# Patient Record
Sex: Male | Born: 1970 | Race: Black or African American | Hispanic: No | Smoking: Never smoker
Health system: Southern US, Community
[De-identification: ages and names within clinical notes are randomized; demographics above are authoritative.]

## PROBLEM LIST (undated history)

## (undated) DIAGNOSIS — G4730 Sleep apnea, unspecified: Secondary | ICD-10-CM

## (undated) DIAGNOSIS — I10 Essential (primary) hypertension: Secondary | ICD-10-CM

## (undated) DIAGNOSIS — C801 Malignant (primary) neoplasm, unspecified: Secondary | ICD-10-CM

## (undated) DIAGNOSIS — E7800 Pure hypercholesterolemia, unspecified: Secondary | ICD-10-CM

## (undated) DIAGNOSIS — M940 Chondrocostal junction syndrome [Tietze]: Secondary | ICD-10-CM

## (undated) HISTORY — PX: ABDOMINAL SURGERY: SHX537

## (undated) HISTORY — PX: SMALL INTESTINE SURGERY: SHX150

---

## 1998-06-18 ENCOUNTER — Emergency Department (HOSPITAL_COMMUNITY): Admission: EM | Admit: 1998-06-18 | Discharge: 1998-06-18 | Payer: Self-pay | Admitting: Emergency Medicine

## 2001-10-25 ENCOUNTER — Encounter: Payer: Self-pay | Admitting: Emergency Medicine

## 2001-10-25 ENCOUNTER — Emergency Department (HOSPITAL_COMMUNITY): Admission: EM | Admit: 2001-10-25 | Discharge: 2001-10-25 | Payer: Self-pay | Admitting: Emergency Medicine

## 2001-11-05 ENCOUNTER — Ambulatory Visit (HOSPITAL_COMMUNITY): Admission: RE | Admit: 2001-11-05 | Discharge: 2001-11-05 | Payer: Self-pay | Admitting: Orthopedic Surgery

## 2001-11-05 ENCOUNTER — Encounter: Payer: Self-pay | Admitting: Orthopedic Surgery

## 2003-03-01 ENCOUNTER — Emergency Department (HOSPITAL_COMMUNITY): Admission: EM | Admit: 2003-03-01 | Discharge: 2003-03-01 | Payer: Self-pay | Admitting: Emergency Medicine

## 2004-01-25 ENCOUNTER — Emergency Department (HOSPITAL_COMMUNITY): Admission: EM | Admit: 2004-01-25 | Discharge: 2004-01-26 | Payer: Self-pay | Admitting: Emergency Medicine

## 2004-01-29 ENCOUNTER — Emergency Department (HOSPITAL_COMMUNITY): Admission: EM | Admit: 2004-01-29 | Discharge: 2004-01-29 | Payer: Self-pay | Admitting: Family Medicine

## 2007-04-26 ENCOUNTER — Ambulatory Visit: Payer: Self-pay | Admitting: *Deleted

## 2007-04-26 ENCOUNTER — Emergency Department (HOSPITAL_COMMUNITY): Admission: EM | Admit: 2007-04-26 | Discharge: 2007-04-26 | Payer: Self-pay | Admitting: Emergency Medicine

## 2007-06-07 ENCOUNTER — Ambulatory Visit: Payer: Self-pay | Admitting: Internal Medicine

## 2007-06-07 LAB — CONVERTED CEMR LAB
BUN: 14 mg/dL (ref 6–23)
Chloride: 103 meq/L (ref 96–112)
Glucose, Bld: 78 mg/dL (ref 70–99)
Potassium: 4.2 meq/L (ref 3.5–5.3)

## 2007-06-29 ENCOUNTER — Ambulatory Visit: Payer: Self-pay | Admitting: Family Medicine

## 2007-07-29 ENCOUNTER — Ambulatory Visit: Payer: Self-pay | Admitting: Internal Medicine

## 2007-09-10 ENCOUNTER — Ambulatory Visit: Payer: Self-pay | Admitting: Internal Medicine

## 2007-09-10 LAB — CONVERTED CEMR LAB
AST: 19 units/L (ref 0–37)
Albumin: 4.5 g/dL (ref 3.5–5.2)
Alkaline Phosphatase: 67 units/L (ref 39–117)
Basophils Absolute: 0 10*3/uL (ref 0.0–0.1)
Basophils Relative: 0 % (ref 0–1)
Eosinophils Absolute: 0 10*3/uL — ABNORMAL LOW (ref 0.2–0.7)
LDL Cholesterol: 161 mg/dL — ABNORMAL HIGH (ref 0–99)
MCHC: 32.5 g/dL (ref 30.0–36.0)
MCV: 83.4 fL (ref 78.0–100.0)
Microalb, Ur: 0.2 mg/dL (ref 0.00–1.89)
Neutrophils Relative %: 61 % (ref 43–77)
Platelets: 302 10*3/uL (ref 150–400)
Potassium: 3.7 meq/L (ref 3.5–5.3)
Sodium: 139 meq/L (ref 135–145)
Total Protein: 8.5 g/dL — ABNORMAL HIGH (ref 6.0–8.3)
WBC: 6.8 10*3/uL (ref 4.0–10.5)

## 2007-12-14 ENCOUNTER — Ambulatory Visit: Payer: Self-pay | Admitting: Internal Medicine

## 2008-02-15 ENCOUNTER — Ambulatory Visit (HOSPITAL_COMMUNITY): Admission: RE | Admit: 2008-02-15 | Discharge: 2008-02-15 | Payer: Self-pay | Admitting: Internal Medicine

## 2008-02-15 ENCOUNTER — Ambulatory Visit: Payer: Self-pay | Admitting: Internal Medicine

## 2008-02-15 LAB — CONVERTED CEMR LAB
ALT: 30 units/L (ref 0–53)
AST: 21 units/L (ref 0–37)
Albumin: 4.3 g/dL (ref 3.5–5.2)
Calcium: 9.4 mg/dL (ref 8.4–10.5)
Chloride: 104 meq/L (ref 96–112)
Creatinine, Ser: 0.93 mg/dL (ref 0.40–1.50)
LDL Cholesterol: 70 mg/dL (ref 0–99)
Potassium: 4.2 meq/L (ref 3.5–5.3)
Total CHOL/HDL Ratio: 2.8

## 2008-05-24 ENCOUNTER — Ambulatory Visit: Payer: Self-pay | Admitting: Internal Medicine

## 2008-05-24 LAB — CONVERTED CEMR LAB

## 2008-09-13 ENCOUNTER — Ambulatory Visit: Payer: Self-pay | Admitting: Internal Medicine

## 2009-01-03 ENCOUNTER — Ambulatory Visit: Payer: Self-pay | Admitting: Internal Medicine

## 2009-11-05 ENCOUNTER — Ambulatory Visit: Payer: Self-pay | Admitting: Internal Medicine

## 2009-11-05 LAB — CONVERTED CEMR LAB
CO2: 25 meq/L (ref 19–32)
Calcium: 9.3 mg/dL (ref 8.4–10.5)
Free T4: 0.82 ng/dL (ref 0.80–1.80)
Hgb A1c MFr Bld: 6.3 % — ABNORMAL HIGH (ref 4.6–6.1)
Sodium: 141 meq/L (ref 135–145)

## 2009-12-18 ENCOUNTER — Ambulatory Visit: Payer: Self-pay | Admitting: Internal Medicine

## 2010-03-19 ENCOUNTER — Ambulatory Visit: Payer: Self-pay | Admitting: Internal Medicine

## 2010-03-19 ENCOUNTER — Ambulatory Visit (HOSPITAL_COMMUNITY): Admission: RE | Admit: 2010-03-19 | Discharge: 2010-03-19 | Payer: Self-pay | Admitting: Internal Medicine

## 2010-03-21 ENCOUNTER — Ambulatory Visit: Payer: Self-pay | Admitting: Internal Medicine

## 2010-04-02 ENCOUNTER — Ambulatory Visit: Payer: Self-pay | Admitting: Internal Medicine

## 2011-05-08 ENCOUNTER — Emergency Department (HOSPITAL_COMMUNITY)
Admission: EM | Admit: 2011-05-08 | Discharge: 2011-05-08 | Disposition: A | Discharge status: Home / Self Care | Payer: BC Managed Care – PPO | Attending: Emergency Medicine | Admitting: Emergency Medicine

## 2011-05-08 DIAGNOSIS — I10 Essential (primary) hypertension: Secondary | ICD-10-CM | POA: Insufficient documentation

## 2011-05-08 DIAGNOSIS — Z79899 Other long term (current) drug therapy: Secondary | ICD-10-CM | POA: Insufficient documentation

## 2011-06-19 ENCOUNTER — Emergency Department (HOSPITAL_COMMUNITY)
Admission: EM | Admit: 2011-06-19 | Discharge: 2011-06-19 | Disposition: A | Discharge status: Other Acute Inpatient Hospital | Payer: BC Managed Care – PPO | Source: Home / Self Care | Attending: Emergency Medicine | Admitting: Emergency Medicine

## 2011-06-19 ENCOUNTER — Inpatient Hospital Stay (HOSPITAL_COMMUNITY)
Admission: EM | Admit: 2011-06-19 | Discharge: 2011-06-20 | DRG: 145 | Disposition: A | Discharge status: Home / Self Care | Payer: BC Managed Care – PPO | Source: Other Acute Inpatient Hospital | Attending: Interventional Cardiology | Admitting: Interventional Cardiology

## 2011-06-19 ENCOUNTER — Emergency Department (HOSPITAL_COMMUNITY): Payer: BC Managed Care – PPO

## 2011-06-19 DIAGNOSIS — E785 Hyperlipidemia, unspecified: Secondary | ICD-10-CM | POA: Diagnosis present

## 2011-06-19 DIAGNOSIS — I309 Acute pericarditis, unspecified: Principal | ICD-10-CM | POA: Diagnosis present

## 2011-06-19 DIAGNOSIS — R079 Chest pain, unspecified: Secondary | ICD-10-CM | POA: Insufficient documentation

## 2011-06-19 DIAGNOSIS — Z7982 Long term (current) use of aspirin: Secondary | ICD-10-CM

## 2011-06-19 DIAGNOSIS — R002 Palpitations: Secondary | ICD-10-CM | POA: Diagnosis present

## 2011-06-19 DIAGNOSIS — I10 Essential (primary) hypertension: Secondary | ICD-10-CM | POA: Insufficient documentation

## 2011-06-19 DIAGNOSIS — G4733 Obstructive sleep apnea (adult) (pediatric): Secondary | ICD-10-CM | POA: Diagnosis present

## 2011-06-19 DIAGNOSIS — E669 Obesity, unspecified: Secondary | ICD-10-CM | POA: Diagnosis present

## 2011-06-19 DIAGNOSIS — R42 Dizziness and giddiness: Secondary | ICD-10-CM | POA: Diagnosis present

## 2011-06-19 DIAGNOSIS — I214 Non-ST elevation (NSTEMI) myocardial infarction: Secondary | ICD-10-CM | POA: Insufficient documentation

## 2011-06-19 LAB — POCT I-STAT TROPONIN I: Troponin i, poc: 0.14 ng/mL (ref 0.00–0.08)

## 2011-06-19 LAB — DIFFERENTIAL
Basophils Absolute: 0 10*3/uL (ref 0.0–0.1)
Basophils Relative: 0 % (ref 0–1)
Eosinophils Absolute: 0 10*3/uL (ref 0.0–0.7)
Eosinophils Relative: 0 % (ref 0–5)
Lymphocytes Relative: 32 % (ref 12–46)

## 2011-06-19 LAB — COMPREHENSIVE METABOLIC PANEL
AST: 38 U/L — ABNORMAL HIGH (ref 0–37)
Albumin: 4.1 g/dL (ref 3.5–5.2)
BUN: 15 mg/dL (ref 6–23)
Calcium: 9.8 mg/dL (ref 8.4–10.5)
Creatinine, Ser: 1.12 mg/dL (ref 0.50–1.35)

## 2011-06-19 LAB — CBC
MCHC: 34.2 g/dL (ref 30.0–36.0)
Platelets: 247 10*3/uL (ref 150–400)
RDW: 14.2 % (ref 11.5–15.5)
WBC: 7.2 10*3/uL (ref 4.0–10.5)

## 2011-06-19 LAB — TROPONIN I: Troponin I: 0.41 ng/mL (ref <0.30)

## 2011-06-19 LAB — PROTIME-INR
INR: 1.08 (ref 0.00–1.49)
Prothrombin Time: 14.2 seconds (ref 11.6–15.2)

## 2011-06-19 LAB — CARDIAC PANEL(CRET KIN+CKTOT+MB+TROPI)
CK, MB: 6.8 ng/mL (ref 0.3–4.0)
Relative Index: 0.6 (ref 0.0–2.5)
Troponin I: <0.3 ng/mL (ref <0.30)

## 2011-06-19 LAB — HEPARIN LEVEL (UNFRACTIONATED): Heparin Unfractionated: <0.1 IU/mL — ABNORMAL LOW (ref 0.30–0.70)

## 2011-06-19 LAB — CK TOTAL AND CKMB (NOT AT ARMC)
CK, MB: 6.9 ng/mL (ref 0.3–4.0)
Total CK: 1112 U/L — ABNORMAL HIGH (ref 7–232)

## 2011-06-19 LAB — D-DIMER, QUANTITATIVE: D-Dimer, Quant: 0.27 ug/mL-FEU (ref 0.00–0.48)

## 2011-06-20 ENCOUNTER — Inpatient Hospital Stay (HOSPITAL_COMMUNITY): Payer: BC Managed Care – PPO

## 2011-06-20 ENCOUNTER — Other Ambulatory Visit (HOSPITAL_COMMUNITY): Payer: BC Managed Care – PPO

## 2011-06-20 LAB — CARDIAC PANEL(CRET KIN+CKTOT+MB+TROPI)
CK, MB: 6.3 ng/mL (ref 0.3–4.0)
CK, MB: 5.4 ng/mL — ABNORMAL HIGH (ref 0.3–4.0)
Relative Index: 0.6 (ref 0.0–2.5)
Total CK: 958 U/L — ABNORMAL HIGH (ref 7–232)
Total CK: 875 U/L — ABNORMAL HIGH (ref 7–232)

## 2011-06-20 LAB — IRON: Iron: 75 ug/dL (ref 42–135)

## 2011-06-20 LAB — TSH: TSH: 0.733 u[IU]/mL (ref 0.350–4.500)

## 2011-06-20 MED ORDER — TECHNETIUM TC 99M TETROFOSMIN IV KIT
30.0000 | PACK | Freq: Once | INTRAVENOUS | Status: AC | PRN
  Administered 2011-06-20: 30 via INTRAVENOUS

## 2011-06-20 MED ORDER — TECHNETIUM TC 99M TETROFOSMIN IV KIT
10.0000 | PACK | Freq: Once | INTRAVENOUS | Status: AC | PRN
  Administered 2011-06-20: 10 via INTRAVENOUS

## 2011-06-27 NOTE — H&P (Signed)
NAMEMarland Kitchen  Jeremy Sanchez, Jeremy Sanchez NO.:  [id number]  MEDICAL RECORD NO.:  [id number]  LOCATION:  3701                         FACILITY:  [facility]  PHYSICIAN:  Lyn Records, M.D.   DATE OF BIRTH:  April 03, 1971  DATE OF ADMISSION:  06/19/2011 DATE OF DISCHARGE:                             HISTORY & PHYSICAL   SUBJECTIVE:  The patient is 40 years of age and gives a history of chest pain that has been intermittent over the past several months (up to 6 months).  He has seen Dr. Loleta Chance for this in the past.  He has also seen other physicians.  Someone has given him nitroglycerin.  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  This discomfort and constellation of symptoms lasted 3-4 hours.  It resolved spontaneously. He had a good night.  This morning after awakening and starting to get ready for work, he began feeling dizzy and began having palpitations. Because of the prolonged episode of discomfort on the prior day, he came to the emergency room.  Since arriving in the emergency room, he has been asymptomatic.  We were called because the patient's initial troponin was mildly elevated.  IV heparin has been started by the ER staff.  SOCIAL HISTORY:  The patient is married.  He works as a [employer] manager.  He has children.  He does not smoke or drink.  FAMILY HISTORY:  His mother and father are alive.  His mother has diabetes but no heart disease.  His father has diabetes and coronary artery disease having had previous stents.  His maternal grandmother also has coronary artery disease.  He has two younger siblings in good health.  ALLERGIES:  None.  PAST MEDICAL HISTORY: 1. Hyperlipidemia. 2. Hypertension. 3. Obesity. 4. Sleep apnea on CPAP.  MEDICATIONS: 1. Hydrochlorothiazide 25 mg every morning. 2. Benazepril 40 mg every  day. 3. Lipitor 20 mg every day. 4. Chewable aspirin 81 mg per day. 5. p.r.n. sublingual nitroglycerin.  REVIEW OF SYSTEMS:  Sleep apnea.  Obesity.  No prior surgeries.  No history of syncope.  No exertional complaints.  No history of diabetes.  OBJECTIVE:  VITAL SIGNS:  Blood pressure 140/90, heart rate 80. SKIN:  Warm and dry. HEENT:  Unremarkable. NECK:  No JVD, carotid bruits, or thyromegaly. CHEST:  Clear. CARDIAC:  No murmur, rub, click, or gallop. ABDOMEN:  Soft and nontender.  Bowel sounds are normal. EXTREMITIES:  Reveal no edema. NEUROLOGIC:  Unremarkable.  EKG reveals diffuse ST elevation without reciprocal change.  This particular EKG was done at 7:07 a.m.  No prior tracings are available for comparison.  SGOT is 38.  Chest x-ray is unremarkable.  Initial troponin I by point of care is 0.14 and by regular laboratory 0.41.  ASSESSMENT: 1. Probable myopericarditis given the patient's EKG and symptoms.     Ischemic heart disease seems less likely. 2. Palpitations either supraventricular tachycardia/atrial fib flutter     or ventricular arrhythmias if this is truly a mild carditis. 3. Obstructive sleep apnea. 4. History of hypertension.  PLAN: 1. Discontinue  IV heparin until we discern whether or not there is     pericardial inflammation which would increase the risk of     pericardial bleeding. 2. Cycle cardiac markers. 3. Repeat EKGs. 4. Monitor for arrhythmia. 5. May need a stress study or cath depending upon the echo findings. 6. Stat echo.     Lyn Records, M.D.     HWS/MEDQ  D:  06/19/2011  T:  06/19/2011  Job:  [id number]  cc:   Annia Friendly. Loleta Chance, MD  Electronically Signed by Verdis Prime M.D. on 06/27/2011 04:22:55 PM

## 2011-07-13 NOTE — Discharge Summary (Signed)
NAMESRIKAR, Jeremy Sanchez             ACCOUNT NO.:  [id number]  MEDICAL RECORD NO.:  [id number]  LOCATION:  3701                         FACILITY:  [facility]  PHYSICIAN:  Armanda Magic, M.D.     DATE OF BIRTH:  Sep 21, 1971  DATE OF ADMISSION:  06/19/2011 DATE OF DISCHARGE:  06/20/2011                              DISCHARGE SUMMARY   ADMISSION DIAGNOSES: 1. Chest pain. 2. Hyperlipidemia. 3. Hypertension. 4. Obesity. 5. Sleep apnea on CPAP.  DISCHARGE DIAGNOSES: 1. Chest pain most likely secondary to acute pericarditis. 2. Dyslipidemia. 3. Hypertension. 4. Obesity. 5. Obstructive sleep apnea on CPAP.  HISTORY OF PRESENT ILLNESS:  This is a 40 year old male with a history of chest pain that has been intermittent over the past several months up to 6 months.  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  This lasted intermittently for 3-4 hours and resolved spontaneously.  He slept overnight but then in the morning on the day of admission after awakening and starting to get ready for work, he felt dizzy and began having palpitations.  Because of the prolonged episode of chest discomfort on prior day, he came to the emergency room. He was asymptomatic in the ER but his initial troponin was mildly elevated.  HOSPITAL COURSE:  He is admitted to telemetry bed and cardiac enzymes were cycled.  His initial troponin was 0.14 and increased to a peak of 0.41, but rapidly then became normal for the rest of the cardiac enzymes, so it was unclear of the significance of the first 2 point-of- care cardiac enzymes.  His CPKs were noted to be markedly elevated at 1112, an MB of 6.9, relative index of 0.6 which was not consistent with a cardiac etiology.  It was felt that he possibly has underlying acute myopericarditis.   Given his cardiac enzyme results, EKG revealed diffuse ST elevation without reciprocal change.  He was started on nonsteroidal anti-inflammatories and on June 20, 2011, underwent nuclear stress test which showed no evidence of myocardial ischemia or infarction, his EF was 53% with normal wall motion.  The patient was subsequently discharged to home in stable condition on nonsteroidal anti- inflammatories.  The patient's labs during hospital stay showed a white cell count of 7.2, hemoglobin 13.7, hematocrit 40.1, platelet count was 247.  His troponin from point-of-care was 0.14 and second troponin was 0.41 from point-of-care.  CPKs were 1112, 1074, 958, 875 with MBs of 6.9, 6.8, 6.3, 5.4 and relative index of 0.6, 0.6, 0.7, 0.6, INR was 1.08.  Sodium 137, potassium 3.5, chloride 100, CO2 30, glucose 94, BUN 15, creatinine 1.12.  LFTs showed an AST of 38, ALT 28, total protein 8, albumin 4.1, calcium 9.8, alkaline phosphatase 67, total bilirubin 0.7, D-dimer was 0.27.  TSH 0.243 and repeat TSH 0.733, iron 75, C-reactive protein 2.  Chest x-ray was normal.  DISCHARGE MEDICATIONS: 1. Hydrochlorothiazide 25 mg daily. 2. Benazepril 40 mg daily. 3. Lipitor 20 mg daily. 4. Aspirin 81 mg daily. 5. It was recommended that he take ibuprofen 600 mg q.  6 hours daily     for 5 days.  FOLLOWUP:  He will follow up with Cristopher Peru, Dr. Michaelle Copas nurse in 1 week to see how he is doing.  During that office visit, he should have repeat CPK and MB to make sure that they are continued to decline. Further workup per Dr. Verdis Prime as an outpatient.  I have spent a total of 40 minutes in preparing this patient's discharge including his discharge instruction sheet, discharge medication list, discussing plan with the patient and dictating this dictation.     Armanda Magic, M.D.     TT/MEDQ  D:  06/24/2011  T:  06/24/2011  Job:  [id number]  cc:   Lyn Records, M.D.  Electronically Signed by  Armanda Magic M.D. on 07/13/2011 03:13:38 PM

## 2011-08-13 LAB — CBC
HCT: 39.2
Hemoglobin: 13.1
Platelets: 262
RDW: 13.8
WBC: 7.2

## 2011-08-13 LAB — URINALYSIS, ROUTINE W REFLEX MICROSCOPIC
Ketones, ur: NEGATIVE
Nitrite: NEGATIVE
Protein, ur: NEGATIVE
Urobilinogen, UA: 1
pH: 7.5

## 2011-08-13 LAB — BASIC METABOLIC PANEL
BUN: 11
Creatinine, Ser: 1.14
GFR calc non Af Amer: >60

## 2011-08-13 LAB — DIFFERENTIAL
Basophils Absolute: 0
Eosinophils Absolute: 0
Lymphocytes Relative: 27
Lymphs Abs: 2
Neutrophils Relative %: 66

## 2011-08-13 LAB — POCT CARDIAC MARKERS
Myoglobin, poc: 177
Operator id: 4708
Troponin i, poc: <0.05

## 2012-02-08 ENCOUNTER — Emergency Department (HOSPITAL_COMMUNITY): Payer: BC Managed Care – PPO

## 2012-02-08 ENCOUNTER — Emergency Department (HOSPITAL_COMMUNITY)
Admission: EM | Admit: 2012-02-08 | Discharge: 2012-02-08 | Disposition: A | Discharge status: Home / Self Care | Payer: BC Managed Care – PPO | Attending: Emergency Medicine | Admitting: Emergency Medicine

## 2012-02-08 ENCOUNTER — Encounter (HOSPITAL_COMMUNITY): Payer: Self-pay | Admitting: *Deleted

## 2012-02-08 DIAGNOSIS — R0789 Other chest pain: Secondary | ICD-10-CM

## 2012-02-08 DIAGNOSIS — I10 Essential (primary) hypertension: Secondary | ICD-10-CM | POA: Insufficient documentation

## 2012-02-08 DIAGNOSIS — E7800 Pure hypercholesterolemia, unspecified: Secondary | ICD-10-CM | POA: Insufficient documentation

## 2012-02-08 DIAGNOSIS — Z8679 Personal history of other diseases of the circulatory system: Secondary | ICD-10-CM | POA: Insufficient documentation

## 2012-02-08 HISTORY — DX: Pure hypercholesterolemia, unspecified: E78.00

## 2012-02-08 HISTORY — DX: Chondrocostal junction syndrome (tietze): M94.0

## 2012-02-08 HISTORY — DX: Essential (primary) hypertension: I10

## 2012-02-08 HISTORY — DX: Sleep apnea, unspecified: G47.30

## 2012-02-08 LAB — COMPREHENSIVE METABOLIC PANEL
ALT: 37 U/L (ref 0–53)
AST: 31 U/L (ref 0–37)
Albumin: 4 g/dL (ref 3.5–5.2)
CO2: 27 mEq/L (ref 19–32)
Calcium: 9.5 mg/dL (ref 8.4–10.5)
Creatinine, Ser: 1.02 mg/dL (ref 0.50–1.35)
GFR calc non Af Amer: 89 mL/min — ABNORMAL LOW (ref >90)
Sodium: 137 mEq/L (ref 135–145)
Total Protein: 8 g/dL (ref 6.0–8.3)

## 2012-02-08 LAB — CARDIAC PANEL(CRET KIN+CKTOT+MB+TROPI)
CK, MB: 4.3 ng/mL — ABNORMAL HIGH (ref 0.3–4.0)
Relative Index: 0.7 (ref 0.0–2.5)
Troponin I: <0.3 ng/mL (ref <0.30)

## 2012-02-08 LAB — DIFFERENTIAL
Basophils Absolute: 0 10*3/uL (ref 0.0–0.1)
Basophils Relative: 0 % (ref 0–1)
Eosinophils Relative: 1 % (ref 0–5)
Lymphocytes Relative: 27 % (ref 12–46)

## 2012-02-08 LAB — CBC
MCHC: 33.6 g/dL (ref 30.0–36.0)
MCV: 82.5 fL (ref 78.0–100.0)
Platelets: 237 10*3/uL (ref 150–400)
RDW: 13.8 % (ref 11.5–15.5)
WBC: 7.4 10*3/uL (ref 4.0–10.5)

## 2012-02-08 NOTE — ED Notes (Signed)
Pt reports pain in left chest began while putting a ceiling fan bracket in. Pt reports pain was sharp, made him feel dizzy and pt has numbness and tingling into left arm. Pt reports pain lasted for 15 minutes and was sharp in nature. Pt reports current chest pain on left side of chest that is less severe. Pt denies shortness of breath.  Pt reports history of a previous episode of chest pain in August of last year and diagnosed with fluid on heart and was admitted.

## 2012-02-08 NOTE — ED Provider Notes (Signed)
History     CSN: [id number]  Arrival date & time 02/08/12  1055   First MD Initiated Contact with Patient 02/08/12 1132      Chief Complaint  Patient presents with  . Chest Pain    (Consider location/radiation/quality/duration/timing/severity/associated sxs/prior treatment) HPI Comments: Recently admitted for pericarditis.  Was doing well until today.  Denies shortness of breath.  No n/v/d.  Patient is a 41 y.o. male presenting with chest pain. The history is provided by the patient.  Chest Pain The chest pain began 3 - 5 hours ago. Chest pain occurs constantly. The chest pain is unchanged. The pain is associated with coughing. The severity of the pain is moderate. The quality of the pain is described as tightness. The pain does not radiate. Chest pain is worsened by certain positions and deep breathing. Pertinent negatives for primary symptoms include no fever, no cough, no wheezing, no nausea and no vomiting.     Past Medical History  Diagnosis Date  . Hypertension   . Hypercholesteremia   . Costochondritis   . Sleep apnea     Past Surgical History  Procedure Date  . Abdominal surgery   . Small intestine surgery     No family history on file.  History  Substance Use Topics  . Smoking status: Never Smoker   . Smokeless tobacco: Not on file  . Alcohol Use: No      Review of Systems  Constitutional: Negative for fever.  Respiratory: Negative for cough and wheezing.   Cardiovascular: Positive for chest pain.  Gastrointestinal: Negative for nausea and vomiting.  All other systems reviewed and are negative.    Allergies  Review of patient's allergies indicates no known allergies.  Home Medications   Current Outpatient Rx  Name Route Sig Dispense Refill  . ASPIRIN EC 81 MG PO TBEC Oral Take 81 mg by mouth daily.    . ATORVASTATIN CALCIUM 20 MG PO TABS Oral Take 20 mg by mouth daily.    Marland Kitchen BENAZEPRIL HCL 40 MG PO TABS Oral Take 40 mg by mouth daily.    Marland Kitchen  DIPHENHYDRAMINE HCL 25 MG PO CAPS Oral Take 25 mg by mouth every 6 (six) hours as needed. alergies    . HYDROCHLOROTHIAZIDE 25 MG PO TABS Oral Take 25 mg by mouth daily.      BP 138/77  Pulse 83  Temp(Src) 98.1 F (36.7 C) (Oral)  Resp 17  SpO2 97%  Physical Exam  Nursing note and vitals reviewed. Constitutional: He is oriented to person, place, and time. He appears well-developed and well-nourished. No distress.  HENT:  Head: Normocephalic and atraumatic.  Neck: Normal range of motion. Neck supple.  Cardiovascular: Normal rate and regular rhythm.   No murmur heard. Pulmonary/Chest: Breath sounds normal. No respiratory distress. He has no wheezes.  Abdominal: Soft. Bowel sounds are normal. He exhibits no distension. There is no tenderness. There is no rebound.  Musculoskeletal: Normal range of motion. He exhibits no edema.  Neurological: He is alert and oriented to person, place, and time.  Skin: Skin is warm and dry. He is not diaphoretic.    ED Course  Procedures (including critical care time)  Labs Reviewed  COMPREHENSIVE METABOLIC PANEL - Abnormal; Notable for the following:    GFR calc non Af Amer 89 (*)    All other components within normal limits  CARDIAC PANEL(CRET KIN+CKTOT+MB+TROPI) - Abnormal; Notable for the following:    Total CK 605 (*)    CK,  MB 4.3 (*)    All other components within normal limits  CBC  DIFFERENTIAL   Dg Chest Port 1 View  02/08/2012  *RADIOLOGY REPORT*  Clinical Data: Chest pain and short of breath  PORTABLE CHEST - 1 VIEW  Comparison: Chest radiograph 06/19/2011  Findings: Cardiac silhouette is slightly enlarged compared to prior.  There are low lung volumes.  There is central venous congestion.  No overt pulmonary edema or pleural fluid.  IMPRESSION: Increasing heart silhouette and central venous congestion.  Original Report Authenticated By: Genevive Bi, M.D.     No diagnosis found.   Date: 02/08/2012  Rate: 81  Rhythm: normal  sinus rhythm  QRS Axis: normal  Intervals: normal  ST/T Wave abnormalities: normal  Conduction Disutrbances:none  Narrative Interpretation:   Old EKG Reviewed: unchanged    MDM  The presentation, labs, and ekg do not reflect a cardiac etiology.  He had a stress test performed with his prior hospitalization that turned out to be pericarditis.  Will discharge with nsaids, return prn.        Geoffery Lyons, MD 02/08/12 1357

## 2012-02-08 NOTE — Discharge Instructions (Signed)
Chest Pain (Nonspecific) It is often hard to give a specific diagnosis for the cause of chest pain. There is always a chance that your pain could be related to something serious, such as a heart attack or a blood clot in the lungs. You need to follow up with your caregiver for further evaluation. CAUSES   Heartburn.   Pneumonia or bronchitis.   Anxiety or stress.   Inflammation around your heart (pericarditis) or lung (pleuritis or pleurisy).   A blood clot in the lung.   A collapsed lung (pneumothorax). It can develop suddenly on its own (spontaneous pneumothorax) or from injury (trauma) to the chest.   Shingles infection (herpes zoster virus).  The chest wall is composed of bones, muscles, and cartilage. Any of these can be the source of the pain.  The bones can be bruised by injury.   The muscles or cartilage can be strained by coughing or overwork.   The cartilage can be affected by inflammation and become sore (costochondritis).  DIAGNOSIS  Lab tests or other studies, such as X-rays, electrocardiography, stress testing, or cardiac imaging, may be needed to find the cause of your pain.  TREATMENT   Treatment depends on what may be causing your chest pain. Treatment may include:   Acid blockers for heartburn.   Anti-inflammatory medicine.   Pain medicine for inflammatory conditions.   Antibiotics if an infection is present.   You may be advised to change lifestyle habits. This includes stopping smoking and avoiding alcohol, caffeine, and chocolate.   You may be advised to keep your head raised (elevated) when sleeping. This reduces the chance of acid going backward from your stomach into your esophagus.   Most of the time, nonspecific chest pain will improve within 2 to 3 days with rest and mild pain medicine.  HOME CARE INSTRUCTIONS   If antibiotics were prescribed, take your antibiotics as directed. Finish them even if you start to feel better.   For the next few  days, avoid physical activities that bring on chest pain. Continue physical activities as directed.   Do not smoke.   Avoid drinking alcohol.   Only take over-the-counter or prescription medicine for pain, discomfort, or fever as directed by your caregiver.   Follow your caregiver's suggestions for further testing if your chest pain does not go away.   Keep any follow-up appointments you made. If you do not go to an appointment, you could develop lasting (chronic) problems with pain. If there is any problem keeping an appointment, you must call to reschedule.  SEEK MEDICAL CARE IF:   You think you are having problems from the medicine you are taking. Read your medicine instructions carefully.   Your chest pain does not go away, even after treatment.   You develop a rash with blisters on your chest.  SEEK IMMEDIATE MEDICAL CARE IF:   You have increased chest pain or pain that spreads to your arm, neck, jaw, back, or abdomen.   You develop shortness of breath, an increasing cough, or you are coughing up blood.   You have severe back or abdominal pain, feel nauseous, or vomit.   You develop severe weakness, fainting, or chills.   You have a fever.  THIS IS AN EMERGENCY. Do not wait to see if the pain will go away. Get medical help at once. Call your local emergency services (911 in [place] Do not drive yourself to the hospital. MAKE SURE YOU:   Understand these instructions.     Will watch your condition.   Will get help right away if you are not doing well or get worse.  Document Released: 07/23/2005 Document Revised: 10/02/2011 Document Reviewed: 05/18/2008 [facility] Patient Information 2012 [place], [place].     Ibuprofen 600 mg three times daily for the next 5 days.

## 2013-07-30 IMAGING — CR DG CHEST 1V PORT
1 series · 1 of 1 positions shown · non-contrast
Comparison: Chest radiograph 06/19/2011

CLINICAL DATA: Chest pain and short of breath

PORTABLE CHEST - 1 VIEW

[AP]
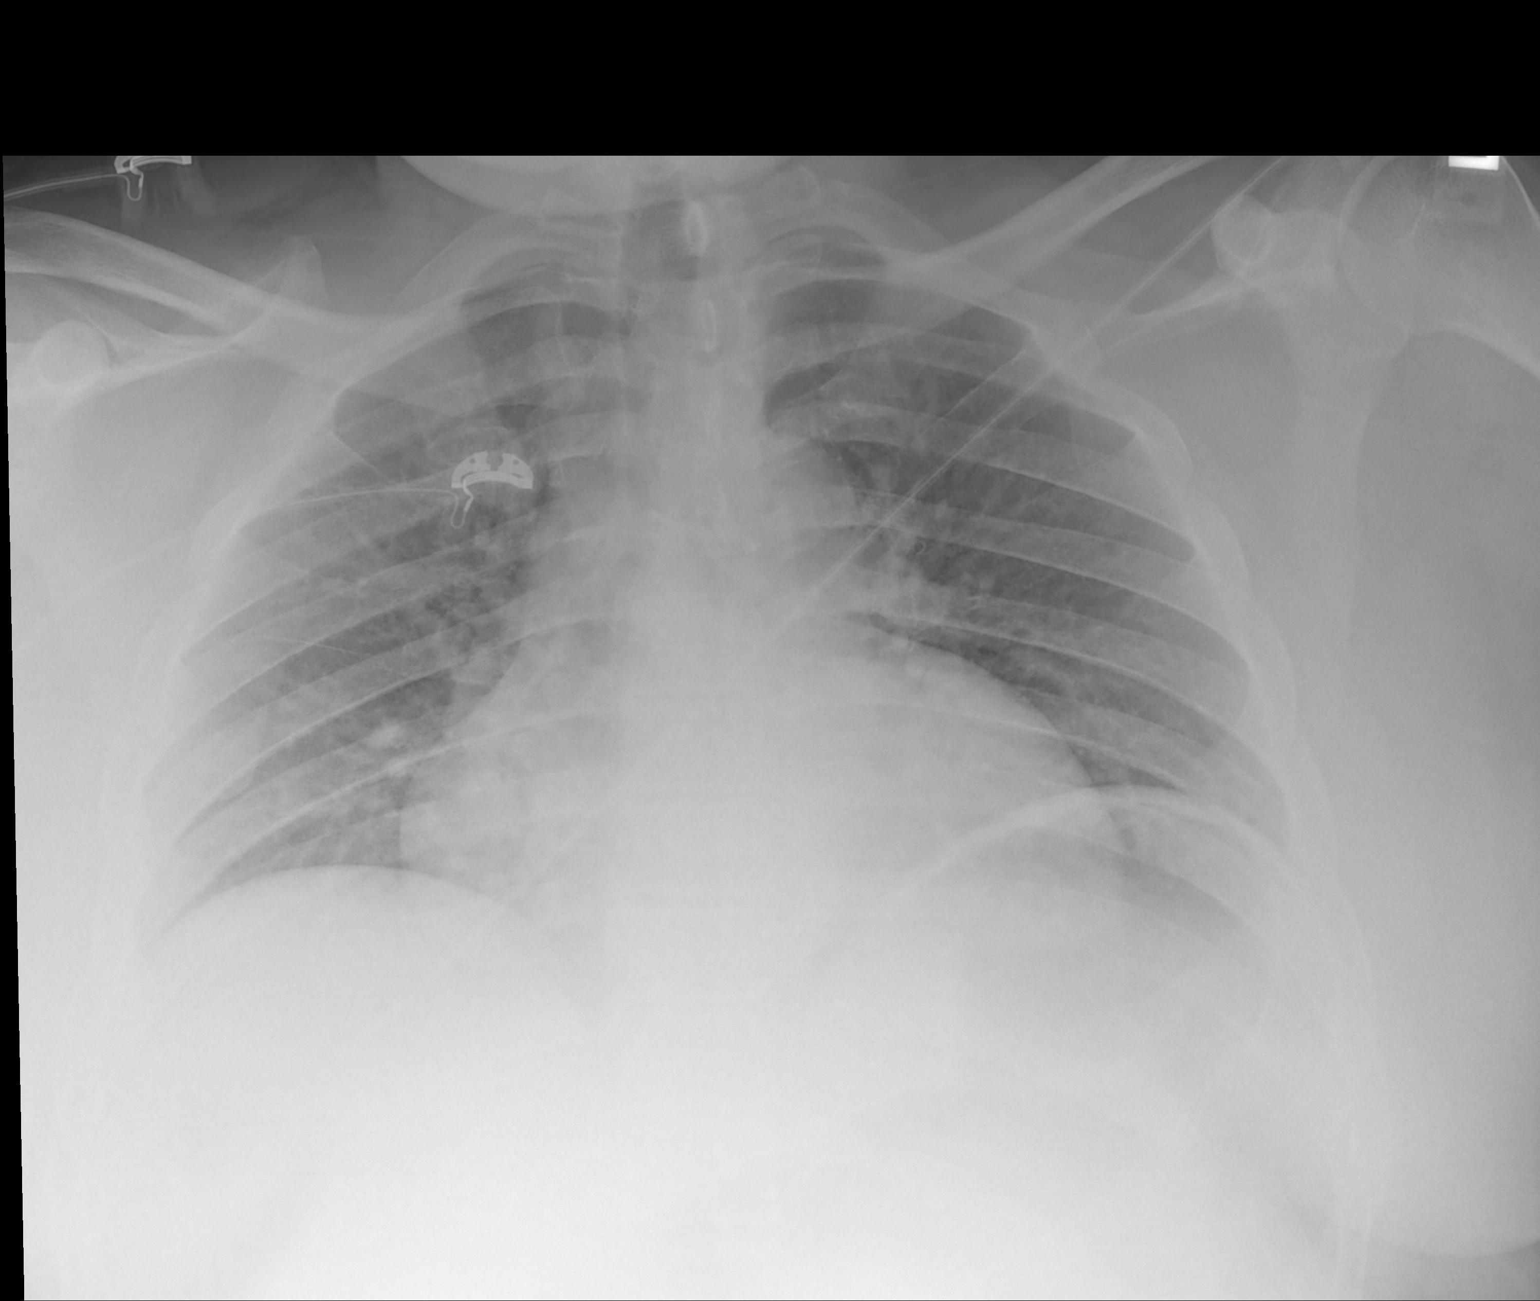

[1 of 1 positions shown; findings below may reference images not displayed]

FINDINGS: Cardiac silhouette is slightly enlarged compared to
prior.  There are low lung volumes.  There is central venous
congestion.  No overt pulmonary edema or pleural fluid.
IMPRESSION: Increasing heart silhouette and central venous congestion.

## 2017-03-02 ENCOUNTER — Emergency Department (HOSPITAL_COMMUNITY)
Admission: EM | Admit: 2017-03-02 | Discharge: 2017-03-02 | Disposition: A | Discharge status: Home / Self Care | Payer: BLUE CROSS/BLUE SHIELD | Attending: Emergency Medicine | Admitting: Emergency Medicine

## 2017-03-02 ENCOUNTER — Encounter (HOSPITAL_COMMUNITY): Payer: Self-pay | Admitting: Emergency Medicine

## 2017-03-02 ENCOUNTER — Emergency Department (HOSPITAL_COMMUNITY): Payer: BLUE CROSS/BLUE SHIELD

## 2017-03-02 DIAGNOSIS — I10 Essential (primary) hypertension: Secondary | ICD-10-CM | POA: Insufficient documentation

## 2017-03-02 DIAGNOSIS — R072 Precordial pain: Secondary | ICD-10-CM | POA: Diagnosis not present

## 2017-03-02 DIAGNOSIS — Z79899 Other long term (current) drug therapy: Secondary | ICD-10-CM | POA: Insufficient documentation

## 2017-03-02 DIAGNOSIS — R079 Chest pain, unspecified: Secondary | ICD-10-CM | POA: Diagnosis present

## 2017-03-02 LAB — I-STAT TROPONIN, ED
TROPONIN I, POC: 0 ng/mL (ref 0.00–0.08)
Troponin i, poc: 0.01 ng/mL (ref 0.00–0.08)

## 2017-03-02 LAB — CBC
HCT: 39.8 % (ref 39.0–52.0)
Hemoglobin: 13.3 g/dL (ref 13.0–17.0)
MCH: 27 pg (ref 26.0–34.0)
MCHC: 33.4 g/dL (ref 30.0–36.0)
MCV: 80.9 fL (ref 78.0–100.0)
PLATELETS: 387 10*3/uL (ref 150–400)
RBC: 4.92 MIL/uL (ref 4.22–5.81)
RDW: 13.5 % (ref 11.5–15.5)
WBC: 8.5 10*3/uL (ref 4.0–10.5)

## 2017-03-02 LAB — BASIC METABOLIC PANEL
Anion gap: 9 (ref 5–15)
BUN: 10 mg/dL (ref 6–20)
CALCIUM: 9.4 mg/dL (ref 8.9–10.3)
CHLORIDE: 100 mmol/L — AB (ref 101–111)
CO2: 27 mmol/L (ref 22–32)
CREATININE: 0.97 mg/dL (ref 0.61–1.24)
GFR calc non Af Amer: >60 mL/min (ref >60)
Glucose, Bld: 112 mg/dL — ABNORMAL HIGH (ref 65–99)
Potassium: 3.7 mmol/L (ref 3.5–5.1)
SODIUM: 136 mmol/L (ref 135–145)

## 2017-03-02 NOTE — ED Notes (Addendum)
Pt is resting comfortably with family at bedside.  Ice water given to pt-pt is updated.  Denies any complaints at this time.  He reports cp resolved around 1000 and abd pain is intermittent

## 2017-03-02 NOTE — ED Provider Notes (Signed)
[facility] DEPT Provider Note   CSN: [id number] Arrival date & time: 03/02/17  0913     History   Chief Complaint Chief Complaint  Patient presents with  . Chest Pain    HPI Jeremy Sanchez is a 46 y.o. male.  HPI Patient presents to the emergency department with intermittent sharp soreness in his left chest over the past 2 weeks.  It is sometimes related to exertion.  He reports pain similar to this before back in 2012 but was diagnosed with pericarditis at that time.  He has no known family history of early cardiac disease.  He is 46 years old.  He has a history of hypertension hyperlipidemia and morbid obesity.  He also reports some increasing weight loss over the past week.  He reports poor appetite.  Past Medical History:  Diagnosis Date  . Costochondritis   . Hypercholesteremia   . Hypertension   . Sleep apnea     There are no active problems to display for this patient.   Past Surgical History:  Procedure Laterality Date  . ABDOMINAL SURGERY    . SMALL INTESTINE SURGERY         Home Medications    Prior to Admission medications   Medication Sig Start Date End Date Taking? Authorizing Provider  benazepril (LOTENSIN) 40 MG tablet Take 40 mg by mouth daily.   Yes [provider]  diphenhydrAMINE (BENADRYL) 25 mg capsule Take 25 mg by mouth every 6 (six) hours as needed. alergies   Yes [provider]  hydrochlorothiazide (HYDRODIURIL) 25 MG tablet Take 25 mg by mouth daily.   Yes [provider]  ranitidine (ZANTAC) 150 MG tablet Take 150 mg by mouth 2 (two) times daily.   Yes [provider]    Family History No family history on file.  Social History Social History  Substance Use Topics  . Smoking status: Never Smoker  . Smokeless tobacco: Not on file  . Alcohol use No     Allergies   Patient has no known allergies.   Review of Systems Review of Systems  All other systems reviewed and are  negative.    Physical Exam Updated Vital Signs BP 119/79 (BP Location: Left Arm)   Pulse 94   Temp 97.4 F (36.3 C) (Oral)   Resp 16   SpO2 98%   Physical Exam  Constitutional: He is oriented to person, place, and time. He appears well-developed and well-nourished.  HENT:  Head: Normocephalic and atraumatic.  Eyes: EOM are normal.  Neck: Normal range of motion.  Cardiovascular: Normal rate, regular rhythm, normal heart sounds and intact distal pulses.   Pulmonary/Chest: Effort normal and breath sounds normal. No respiratory distress.  Abdominal: Soft. He exhibits no distension. There is no tenderness.  Musculoskeletal: Normal range of motion.  Neurological: He is alert and oriented to person, place, and time.  Skin: Skin is warm and dry.  Psychiatric: He has a normal mood and affect. Judgment normal.  Nursing note and vitals reviewed.    ED Treatments / Results  Labs (all labs ordered are listed, but only abnormal results are displayed) Labs Reviewed  BASIC METABOLIC PANEL - Abnormal; Notable for the following:       Result Value   Chloride 100 (*)    Glucose, Bld 112 (*)    All other components within normal limits  CBC  I-STAT TROPOININ, ED  I-STAT TROPOININ, ED    EKG  EKG Interpretation  Date/Time:  Monday Mar 02 2017 09:26:05 EDT Ventricular Rate:  98 PR Interval:    QRS Duration: 84 QT Interval:  321 QTC Calculation: 410 R Axis:   6 Text Interpretation:  Sinus rhythm Probable left atrial enlargement Abnormal R-wave progression, early transition ST elevation, consider inferior injury Baseline wander in lead(s) V1 No significant change was found Confirmed by Novia Lansberry  MD, Godfrey Tritschler ([id number]) on 03/02/2017 9:33:25 AM       Radiology No results found.  Procedures Procedures (including critical care time)  Medications Ordered in ED Medications - No data to display   Initial Impression / Assessment and Plan / ED Course  I have reviewed the triage vital  signs and the nursing notes.  Pertinent labs & imaging results that were available during my care of the patient were reviewed by me and considered in my medical decision making (see chart for details).      Patient is overall well-appearing.  Doubt ACS.  Doubt PE.  Overall well-appearing feeling better.  Discharge home in good condition with primary care follow-up as well as cardiac follow-up.  He understands to return to the ER for new or worsening symptoms   Final Clinical Impressions(s) / ED Diagnoses   Final diagnoses:  Precordial pain    New Prescriptions Discharge Medication List as of 03/02/2017  2:11 PM       Jola Schmidt, MD 03/08/17 [phone]

## 2017-03-02 NOTE — ED Notes (Signed)
Denies n/v/d,  Saw a dr on wed PCP, he noticed pt had lost weight some sob when with exertion and he breaks out in sweatt he also states he is having upper abd pain, stabbing in nature , last bm this am one before that was thursday

## 2017-03-02 NOTE — ED Triage Notes (Signed)
Pt presents with complaint of intermittent chest sharpness, intermittent abdominal soreness, decreased appetite, and weight loss over past week. Pt continues to verbalize "have had this before and they gave me fluid pills for my heart."

## 2017-03-02 NOTE — ED Notes (Signed)
Pt up and ambulatory to [facility].

## 2017-07-02 ENCOUNTER — Ambulatory Visit (HOSPITAL_COMMUNITY): Admit: 2017-07-02 | Payer: BLUE CROSS/BLUE SHIELD

## 2017-07-02 ENCOUNTER — Inpatient Hospital Stay (HOSPITAL_COMMUNITY)
Admission: RE | Admit: 2017-07-02 | Discharge: 2017-07-02 | Disposition: A | Discharge status: Home / Self Care | Payer: BLUE CROSS/BLUE SHIELD | Source: Ambulatory Visit | Attending: Emergency Medicine | Admitting: Emergency Medicine

## 2017-07-02 ENCOUNTER — Encounter (HOSPITAL_COMMUNITY): Payer: Self-pay | Admitting: *Deleted

## 2017-07-02 ENCOUNTER — Emergency Department (HOSPITAL_COMMUNITY)
Admission: EM | Admit: 2017-07-02 | Discharge: 2017-07-02 | Disposition: A | Discharge status: Home / Self Care | Payer: BLUE CROSS/BLUE SHIELD | Attending: Emergency Medicine | Admitting: Emergency Medicine

## 2017-07-02 DIAGNOSIS — C240 Malignant neoplasm of extrahepatic bile duct: Secondary | ICD-10-CM | POA: Diagnosis not present

## 2017-07-02 DIAGNOSIS — Z5321 Procedure and treatment not carried out due to patient leaving prior to being seen by health care provider: Secondary | ICD-10-CM | POA: Insufficient documentation

## 2017-07-02 DIAGNOSIS — R2241 Localized swelling, mass and lump, right lower limb: Secondary | ICD-10-CM | POA: Insufficient documentation

## 2017-07-02 HISTORY — DX: Malignant (primary) neoplasm, unspecified: C80.1

## 2017-07-02 NOTE — ED Triage Notes (Signed)
Pt has stage 4 bile duct cancer and sent here to rule out blood clot in right leg.  Pt has swelling and warmth to right leg, DPP strong and palpable.

## 2017-07-02 NOTE — Progress Notes (Signed)
VASCULAR LAB    Attempted to get patient from ED waiting room for his venous duplex.  However, pre-service called and said patient had left.  I called the ED waiting room and staff called for patient, but patient did not respond.  07/02/17 12:00     Kesi Perrow, RVT 07/02/2017, 12:13 PM

## 2018-02-19 ENCOUNTER — Emergency Department (HOSPITAL_COMMUNITY): Payer: BLUE CROSS/BLUE SHIELD

## 2018-02-19 ENCOUNTER — Encounter (HOSPITAL_COMMUNITY): Payer: Self-pay | Admitting: Emergency Medicine

## 2018-02-19 ENCOUNTER — Other Ambulatory Visit: Payer: Self-pay

## 2018-02-19 DIAGNOSIS — R1011 Right upper quadrant pain: Secondary | ICD-10-CM | POA: Insufficient documentation

## 2018-02-19 DIAGNOSIS — Z7901 Long term (current) use of anticoagulants: Secondary | ICD-10-CM | POA: Insufficient documentation

## 2018-02-19 DIAGNOSIS — I10 Essential (primary) hypertension: Secondary | ICD-10-CM | POA: Insufficient documentation

## 2018-02-19 DIAGNOSIS — C240 Malignant neoplasm of extrahepatic bile duct: Secondary | ICD-10-CM | POA: Insufficient documentation

## 2018-02-19 DIAGNOSIS — R079 Chest pain, unspecified: Secondary | ICD-10-CM | POA: Insufficient documentation

## 2018-02-19 NOTE — ED Triage Notes (Signed)
Patient complaining of pain in right upper abdominal quadrant. Patient states he is having right side chest pain. Patient states he has pain in his liver.

## 2018-02-20 ENCOUNTER — Emergency Department (HOSPITAL_COMMUNITY)
Admission: EM | Admit: 2018-02-20 | Discharge: 2018-02-20 | Disposition: A | Discharge status: Home / Self Care | Payer: BLUE CROSS/BLUE SHIELD | Attending: Emergency Medicine | Admitting: Emergency Medicine

## 2018-02-20 DIAGNOSIS — R1011 Right upper quadrant pain: Secondary | ICD-10-CM

## 2018-02-20 LAB — HEPATIC FUNCTION PANEL
ALT: 29 U/L (ref 17–63)
AST: 77 U/L — ABNORMAL HIGH (ref 15–41)
Albumin: 2.4 g/dL — ABNORMAL LOW (ref 3.5–5.0)
Alkaline Phosphatase: 166 U/L — ABNORMAL HIGH (ref 38–126)
Bilirubin, Direct: 0.5 mg/dL (ref 0.1–0.5)
Indirect Bilirubin: 0.7 mg/dL (ref 0.3–0.9)
Total Bilirubin: 1.2 mg/dL (ref 0.3–1.2)
Total Protein: 8.4 g/dL — ABNORMAL HIGH (ref 6.5–8.1)

## 2018-02-20 LAB — BASIC METABOLIC PANEL
Anion gap: 12 (ref 5–15)
BUN: 42 mg/dL — ABNORMAL HIGH (ref 6–20)
CHLORIDE: 103 mmol/L (ref 101–111)
CO2: 17 mmol/L — ABNORMAL LOW (ref 22–32)
Calcium: 8.6 mg/dL — ABNORMAL LOW (ref 8.9–10.3)
Creatinine, Ser: 3.21 mg/dL — ABNORMAL HIGH (ref 0.61–1.24)
GFR calc Af Amer: 25 mL/min — ABNORMAL LOW (ref >60)
GFR calc non Af Amer: 21 mL/min — ABNORMAL LOW (ref >60)
GLUCOSE: 152 mg/dL — AB (ref 65–99)
POTASSIUM: 4.8 mmol/L (ref 3.5–5.1)
Sodium: 132 mmol/L — ABNORMAL LOW (ref 135–145)

## 2018-02-20 LAB — CBC
HCT: 27 % — ABNORMAL LOW (ref 39.0–52.0)
HEMOGLOBIN: 8.8 g/dL — AB (ref 13.0–17.0)
MCH: 26.7 pg (ref 26.0–34.0)
MCHC: 32.6 g/dL (ref 30.0–36.0)
MCV: 82.1 fL (ref 78.0–100.0)
PLATELETS: 204 10*3/uL (ref 150–400)
RBC: 3.29 MIL/uL — ABNORMAL LOW (ref 4.22–5.81)
RDW: 19.6 % — AB (ref 11.5–15.5)
WBC: 7.6 10*3/uL (ref 4.0–10.5)

## 2018-02-20 LAB — I-STAT TROPONIN, ED: TROPONIN I, POC: 0.01 ng/mL (ref 0.00–0.08)

## 2018-02-20 MED ORDER — PERCOCET 5-325 MG PO TABS: 1.0000 | ORAL_TABLET | Freq: Four times a day (QID) | ORAL | 0 refills | Status: AC | PRN

## 2018-02-20 MED ORDER — SODIUM CHLORIDE 0.9 % IV BOLUS
1000.0000 mL | Freq: Once | INTRAVENOUS | Status: AC
  Administered 2018-02-20: 1000 mL via INTRAVENOUS

## 2018-02-20 MED ORDER — HYDROMORPHONE HCL 1 MG/ML IJ SOLN
1.0000 mg | Freq: Once | INTRAMUSCULAR | Status: AC
  Administered 2018-02-20: 1 mg via INTRAVENOUS
  Filled 2018-02-20: qty 1

## 2018-02-20 NOTE — ED Provider Notes (Signed)
[facility] DEPT Provider Note   CSN: [id number] Arrival date & time: 02/19/18  2250     History   Chief Complaint Chief Complaint  Patient presents with  . Chest Pain  . Flank Pain    HPI Jeremy Sanchez is a 47 y.o. male.  HPI Patient presents to the emergency department with abdominal and side pain started after radiation last week.  The patient states that it felt like something was burning inside of him.  Patient states that he is not having any shortness of breath.  The patient states that nothing seen makes condition better or worse.  He states that he is a fentanyl patch and gabapentin for pain.  He states he does not take any other pain medications.  Patient has stage IV terminal bile duct cancer.  The patient denies chest pain, shortness of breath, headache,blurred vision, neck pain, fever, cough, weakness, numbness, dizziness, anorexia, edema,nausea, vomiting, diarrhea, rash, back pain, dysuria, hematemesis, bloody stool, near syncope, or syncope. Past Medical History:  Diagnosis Date  . Cancer ([place])    bile duct cancer with chemo  . Costochondritis   . Hypercholesteremia   . Hypertension   . Sleep apnea     There are no active problems to display for this patient.   Past Surgical History:  Procedure Laterality Date  . ABDOMINAL SURGERY    . SMALL INTESTINE SURGERY          Home Medications    Prior to Admission medications   Medication Sig Start Date End Date Taking? Authorizing Provider  amLODipine (NORVASC) 10 MG tablet Take 10 mg by mouth daily.   Yes [provider]  benazepril (LOTENSIN) 40 MG tablet Take 40 mg by mouth daily.   Yes [provider]  enoxaparin (LOVENOX) 120 MG/0.8ML injection Inject 120 mg into the skin daily.   Yes [provider]  fentaNYL (DURAGESIC - DOSED MCG/HR) 100 MCG/HR Place 100 mcg onto the skin every 3 (three) days.   Yes [provider]    hydrochlorothiazide (HYDRODIURIL) 25 MG tablet Take 12.5 mg by mouth daily.    Yes [provider]  HYDROmorphone (DILAUDID) 4 MG tablet Take 2-4 mg by mouth every 4 (four) hours as needed for moderate pain or severe pain.   Yes [provider]  ondansetron (ZOFRAN) 8 MG tablet Take 8 mg by mouth every 8 (eight) hours as needed for nausea or vomiting.   Yes [provider]  ranitidine (ZANTAC) 150 MG tablet Take 150 mg by mouth daily as needed for heartburn.    Yes [provider]    Family History History reviewed. No pertinent family history.  Social History Social History   Tobacco Use  . Smoking status: Never Smoker  . Smokeless tobacco: Never Used  Substance Use Topics  . Alcohol use: No  . Drug use: No     Allergies   Patient has no known allergies.   Review of Systems Review of Systems All other systems negative except as documented in the HPI. All pertinent positives and negatives as reviewed in the HPI.jh  Physical Exam Updated Vital Signs BP 118/74   Pulse 98   Temp 98.4 F (36.9 C) (Oral)   Resp (!) 21   SpO2 96%   Physical Exam  Constitutional: He is oriented to person, place, and time. He appears well-developed and well-nourished. No distress.  HENT:  Head: Normocephalic and atraumatic.  Mouth/Throat: Oropharynx is clear and  moist.  Eyes: Pupils are equal, round, and reactive to light.  Neck: Normal range of motion. Neck supple.  Cardiovascular: Normal rate, regular rhythm and normal heart sounds. Exam reveals no gallop and no friction rub.  No murmur heard. Pulmonary/Chest: Effort normal and breath sounds normal. No respiratory distress. He has no wheezes.  Abdominal: Soft. Bowel sounds are normal. He exhibits no distension. There is no tenderness.  Neurological: He is alert and oriented to person, place, and time. He exhibits normal muscle tone. Coordination normal.  Skin: Skin is warm and dry. Capillary refill  takes less than 2 seconds. No rash noted. No erythema.  Psychiatric: He has a normal mood and affect. His behavior is normal.  Nursing note and vitals reviewed.    ED Treatments / Results  Labs (all labs ordered are listed, but only abnormal results are displayed) Labs Reviewed  BASIC METABOLIC PANEL - Abnormal; Notable for the following components:      Result Value   Sodium 132 (*)    CO2 17 (*)    Glucose, Bld 152 (*)    BUN 42 (*)    Creatinine, Ser 3.21 (*)    Calcium 8.6 (*)    GFR calc non Af Amer 21 (*)    GFR calc Af Amer 25 (*)    All other components within normal limits  CBC - Abnormal; Notable for the following components:   RBC 3.29 (*)    Hemoglobin 8.8 (*)    HCT 27.0 (*)    RDW 19.6 (*)    All other components within normal limits  HEPATIC FUNCTION PANEL - Abnormal; Notable for the following components:   Total Protein 8.4 (*)    Albumin 2.4 (*)    AST 77 (*)    Alkaline Phosphatase 166 (*)    All other components within normal limits  I-STAT TROPONIN, ED    EKG EKG Interpretation  Date/Time:  Friday February 19 2018 23:25:52 EDT Ventricular Rate:  124 PR Interval:    QRS Duration: 82 QT Interval:  286 QTC Calculation: 411 R Axis:   34 Text Interpretation:  Sinus tachycardia Probable left atrial enlargement Confirmed by Veryl Speak [phone]) on 02/20/2018 4:23:37 AM   Radiology Dg Chest 2 View  Result Date: 02/19/2018 CLINICAL DATA:  Chest pain EXAM: CHEST - 2 VIEW COMPARISON:  03/02/2017 FINDINGS: Low lung volumes. Patchy atelectasis at the right base. No pleural effusion. Stable cardiomediastinal silhouette. IMPRESSION: Low lung volumes with patchy atelectasis at the right base. Electronically Signed   By: Donavan Foil M.D.   On: 02/19/2018 23:51    Procedures Procedures (including critical care time)  Medications Ordered in ED Medications  sodium chloride 0.9 % bolus 1,000 mL (0 mLs Intravenous Stopped 02/20/18 0500)  HYDROmorphone  (DILAUDID) injection 1 mg (1 mg Intravenous Given 02/20/18 0351)     Initial Impression / Assessment and Plan / ED Course  I have reviewed the triage vital signs and the nursing notes.  Pertinent labs & imaging results that were available during my care of the patient were reviewed by me and considered in my medical decision making (see chart for details).     I do acknowledge that PE is a consideration but his pain is mostly in the abdominal region where he had radiation treatment.  Patient's vital signs improved with IV fluids.  I feel that he is dehydrated.  Patient is feeling dramatically better in the area that was painful following IV pain medications.  Patient's creatinine is baseline based on his recent laboratory testing.  Final Clinical Impressions(s) / ED Diagnoses   Final diagnoses:  None    ED Discharge Orders    None       Dalia Heading, PA-C 02/20/18 [id number]    Veryl Speak, MD 02/20/18 [phone]

## 2018-02-20 NOTE — Discharge Instructions (Addendum)
Return here as needed.  Follow-up with your doctor for a recheck.  Increase fluid intake rest as much as possible.

## 2018-03-27 DEATH — deceased

## 2018-08-22 IMAGING — CR DG CHEST 2V
2 series · 2 of 2 positions shown · non-contrast
Comparison: 02/08/2012

CLINICAL DATA: Mid chest pain.  Shortness of breath on exertion

EXAM:
CHEST  2 VIEW

[w chest pa]
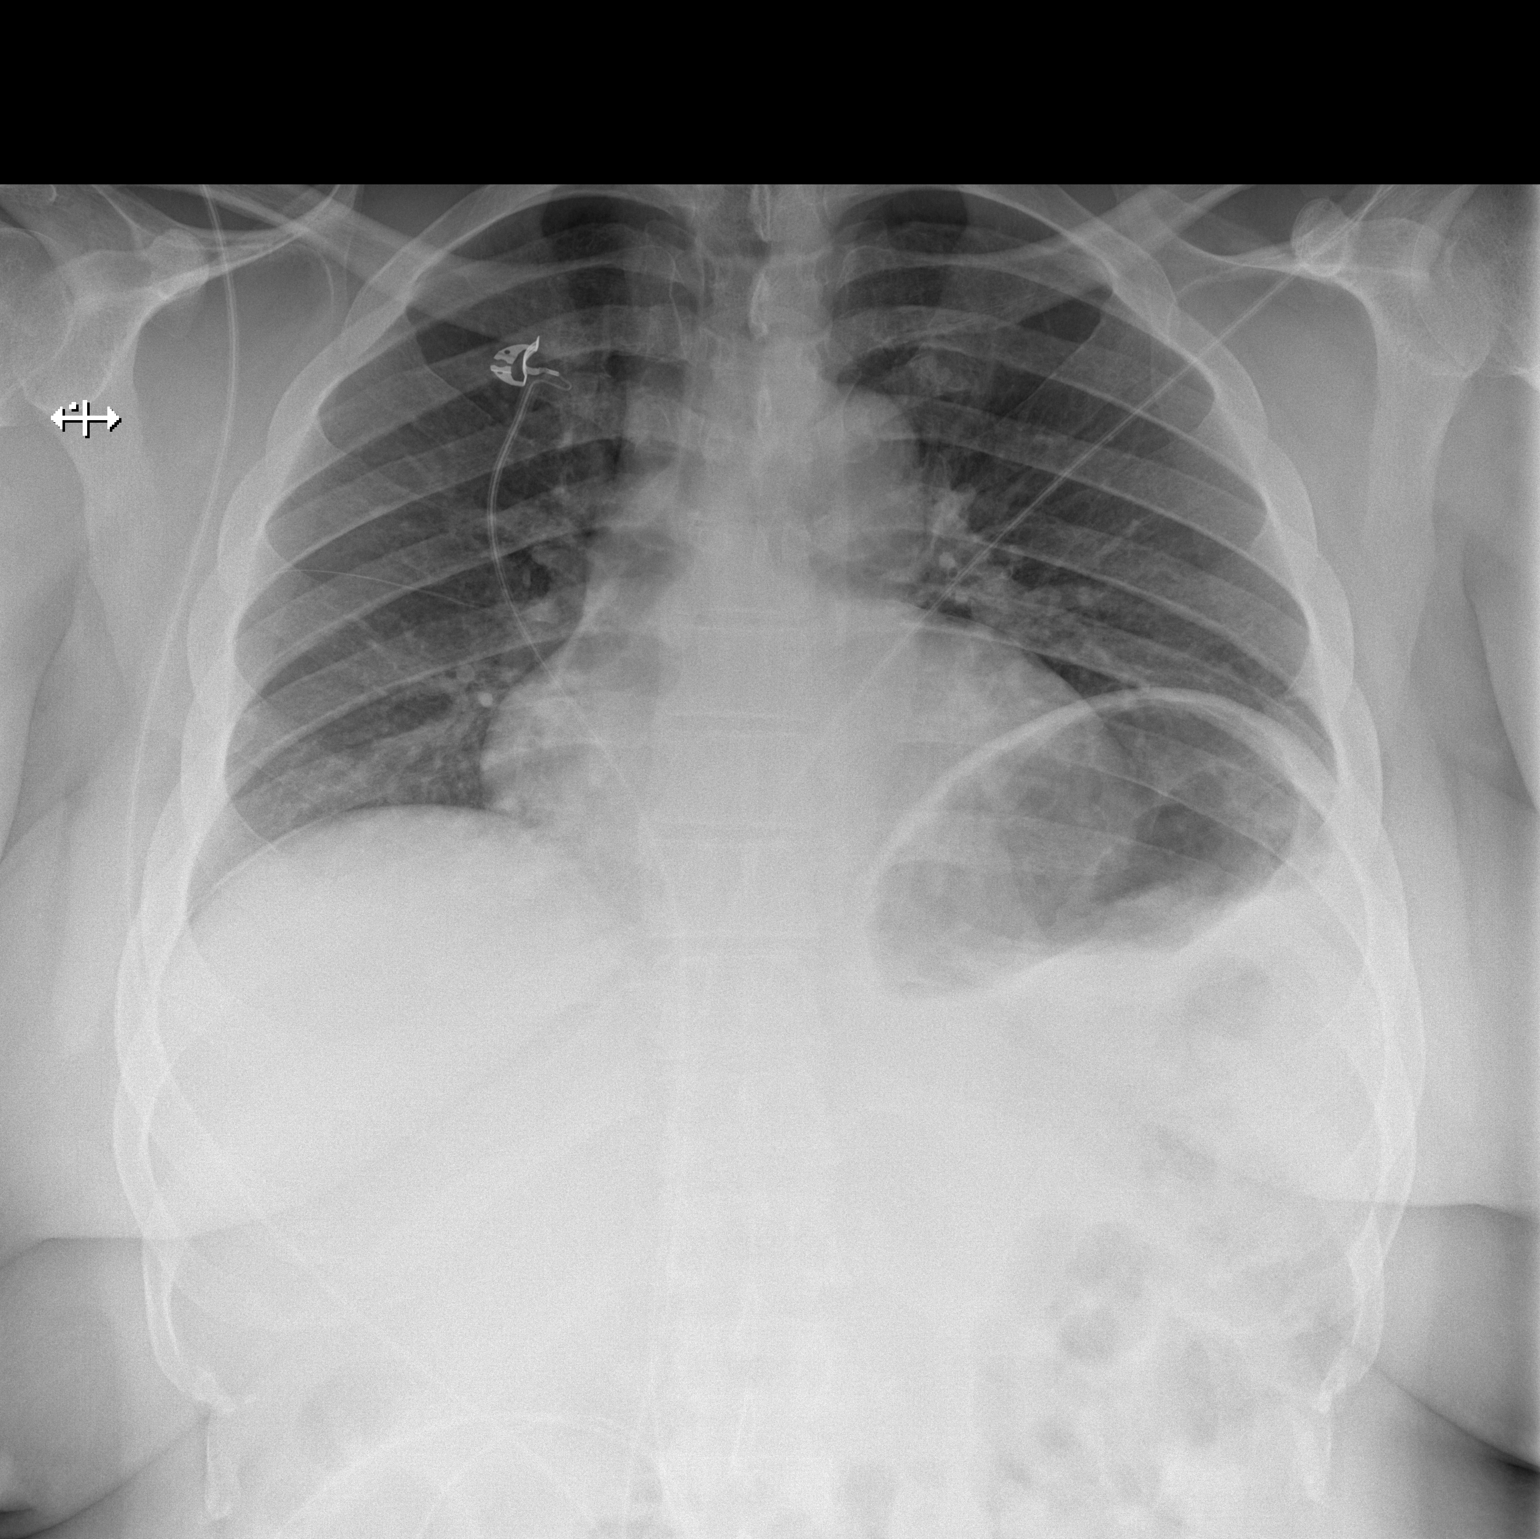

[w chest lat]
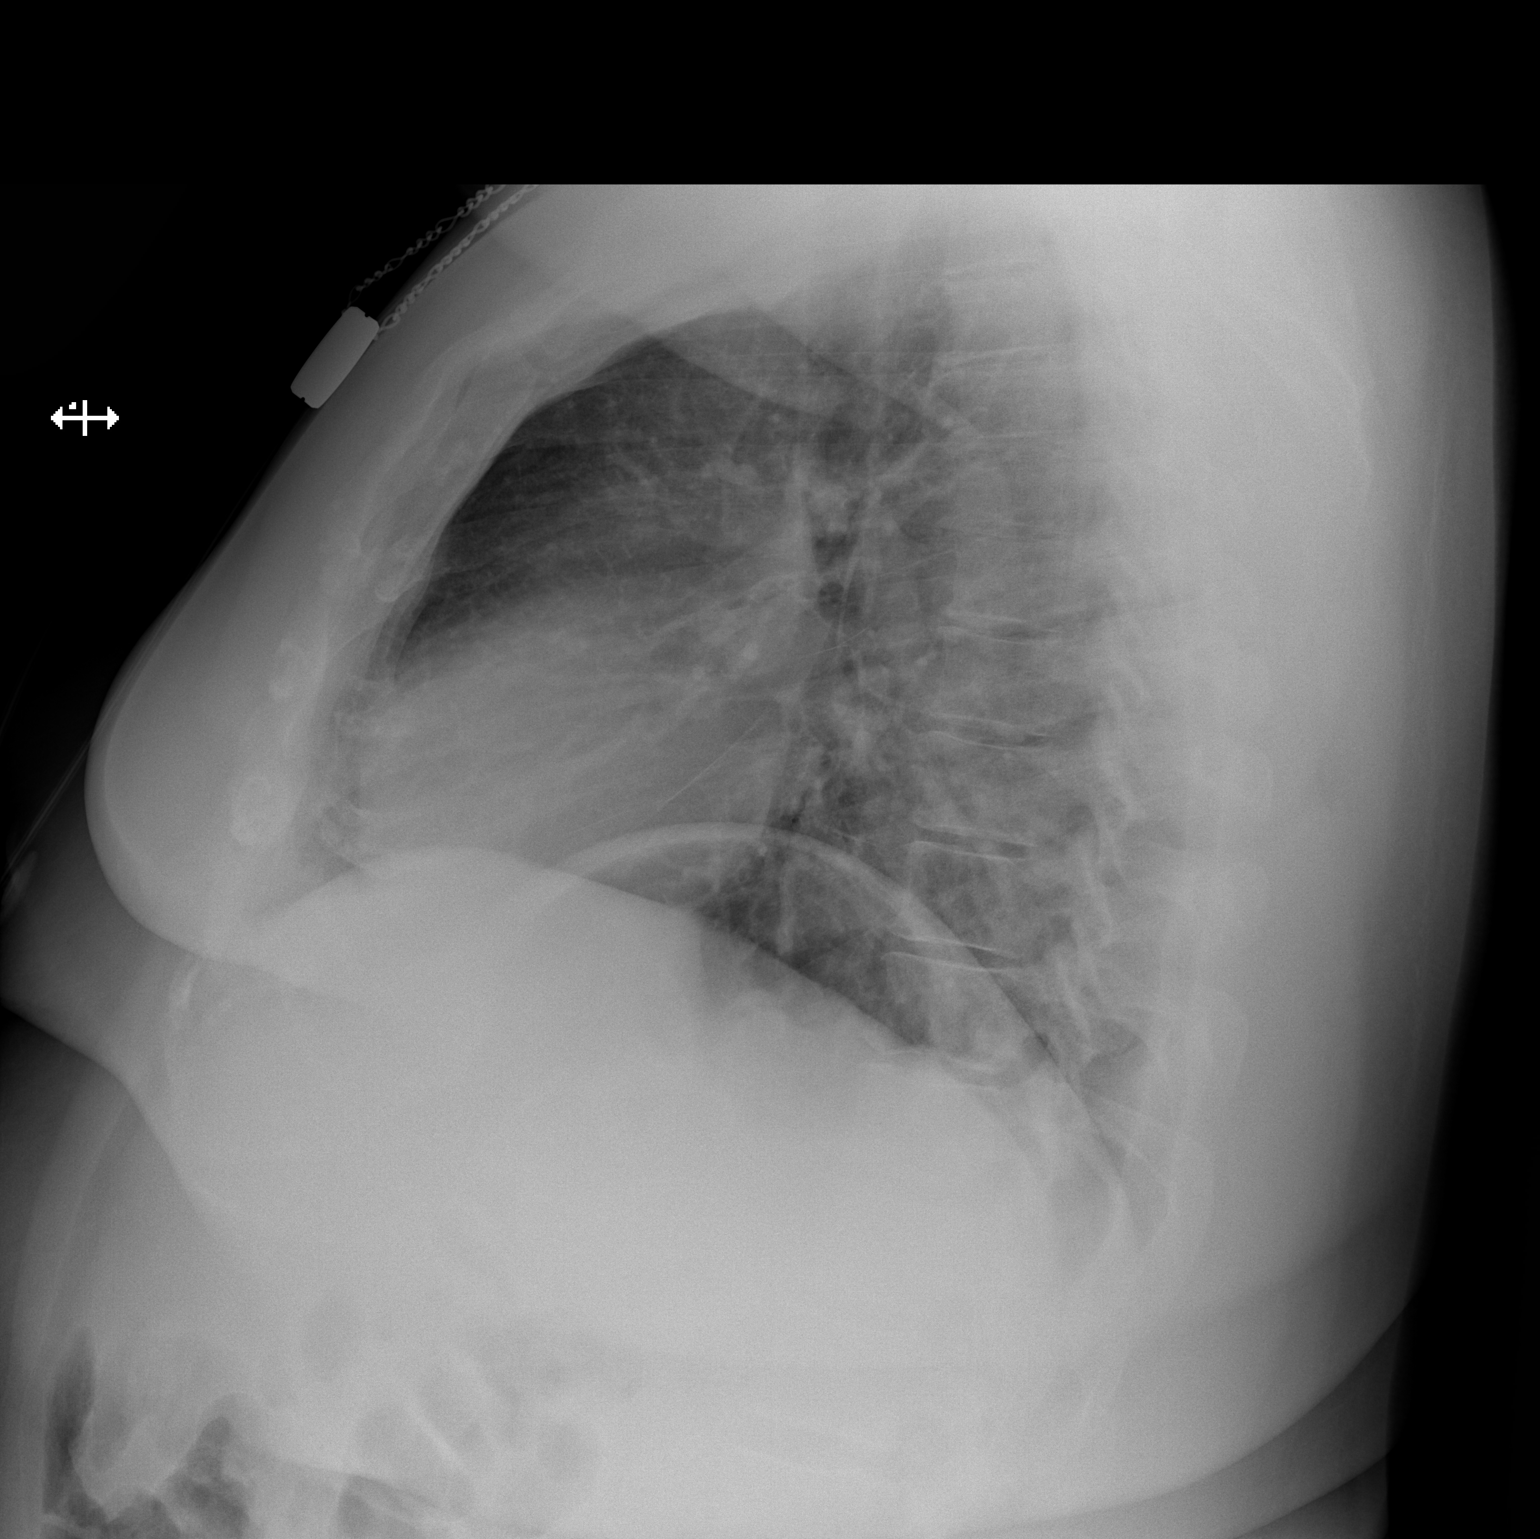

[2 of 2 positions shown; findings below may reference images not displayed]

FINDINGS: There is no focal parenchymal opacity. There is no pleural effusion
or pneumothorax. There is stable cardiomegaly.

The osseous structures are unremarkable.
IMPRESSION: No active cardiopulmonary disease.
